# Patient Record
Sex: Male | Born: 1975 | Race: White | Hispanic: No | Marital: Married | Smoking: Never smoker
Health system: Southern US, Academic
[De-identification: ages and names within clinical notes are randomized; demographics above are authoritative.]

---

## 2015-03-26 IMAGING — CT CT ABD-PELV W/O CM
2 of 4 series · 17 of 46 positions shown, 19 images · non-contrast
Comparison: None.

CLINICAL DATA: Left flank pain.

EXAM:
CT ABDOMEN AND PELVIS WITHOUT CONTRAST
TECHNIQUE: Multidetector CT imaging of the abdomen and pelvis was performed
following the standard protocol without intravenous contrast.

[Series 2: stone study 5.0 i30f 1 · axial · 0.74mm/px · z∈[+835,+1295]mm · 14 of 102 slices shown, 16 images]
[im 5/102  soft-tissue]
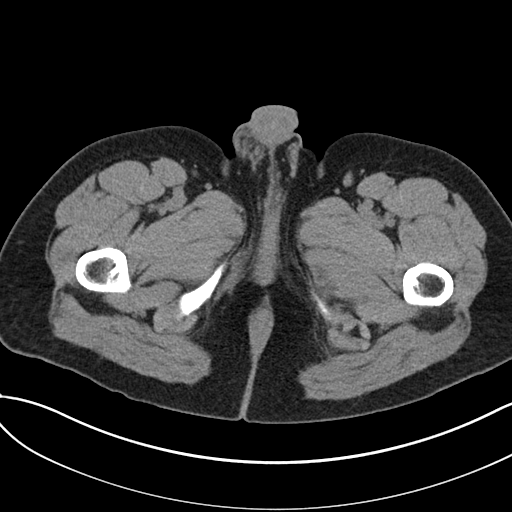
[im 5/102  bone]
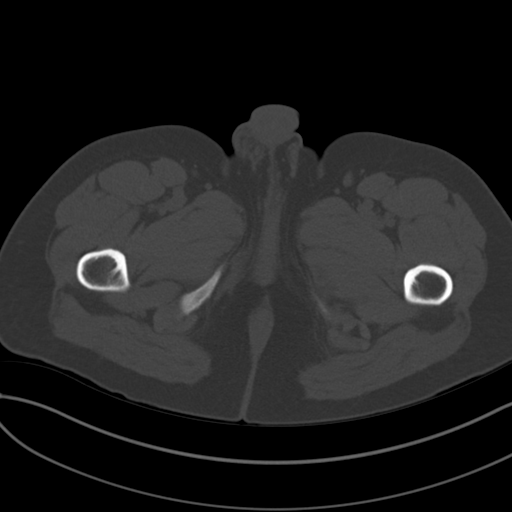
[im 13/102  soft-tissue]
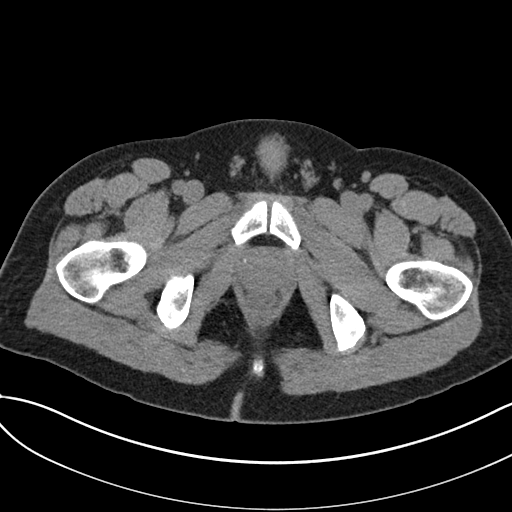
[im 21/102  soft-tissue]
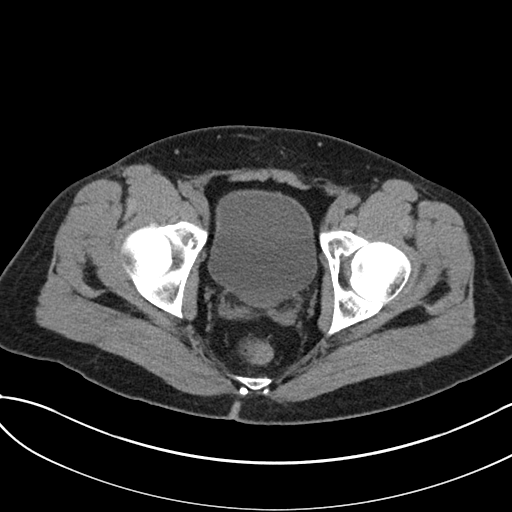
[im 29/102  soft-tissue]
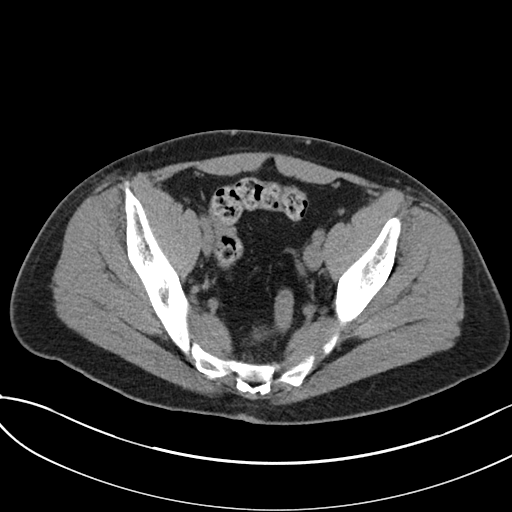
[im 33/102  soft-tissue]
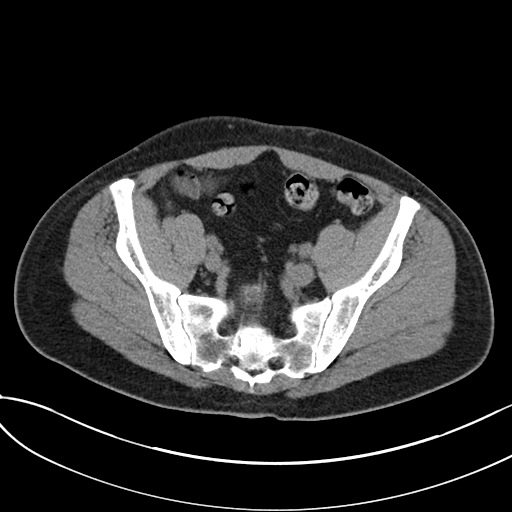
[im 41/102  soft-tissue]
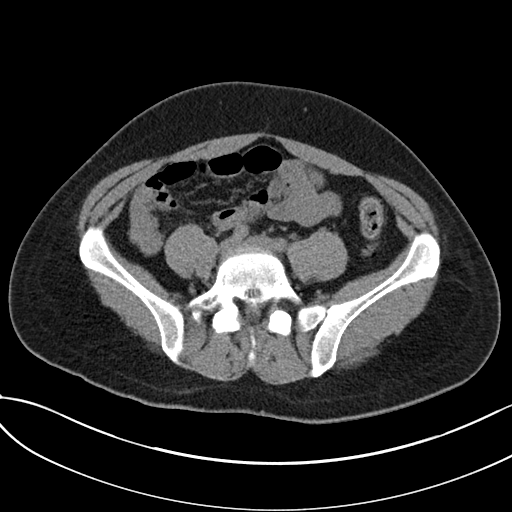
[im 49/102  soft-tissue]
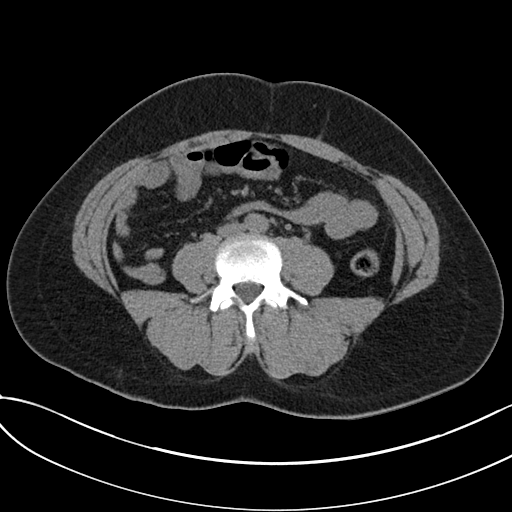
[im 53/102  soft-tissue]
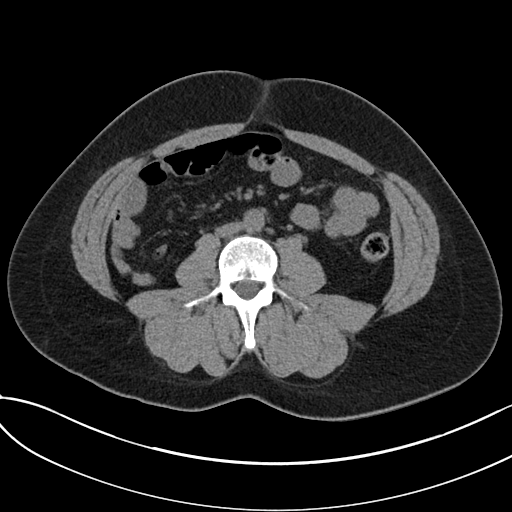
[im 61/102  soft-tissue]
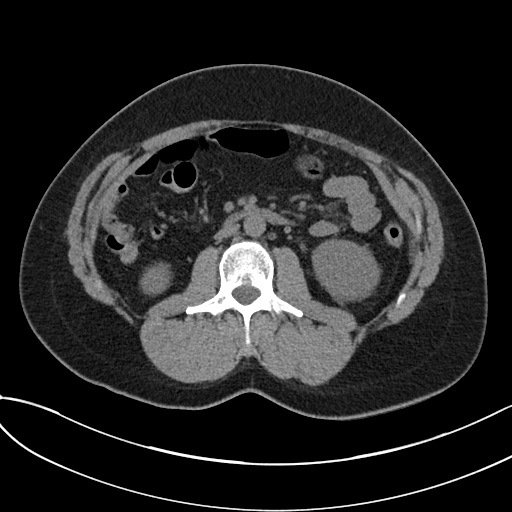
[im 61/102  bone]
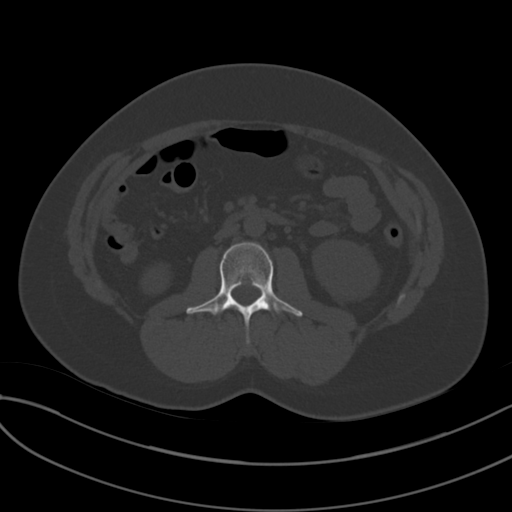
[im 69/102  soft-tissue]
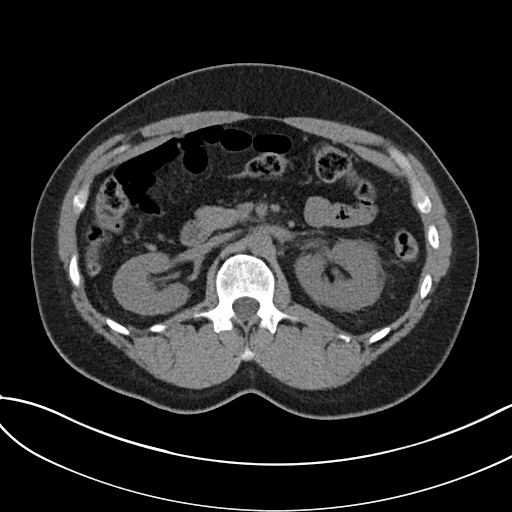
[im 77/102  soft-tissue]
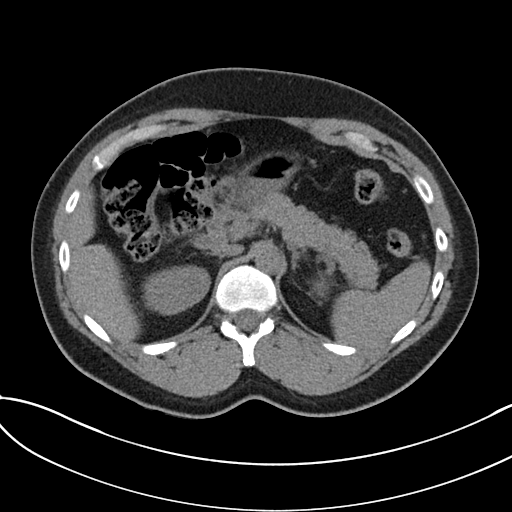
[im 81/102  soft-tissue]
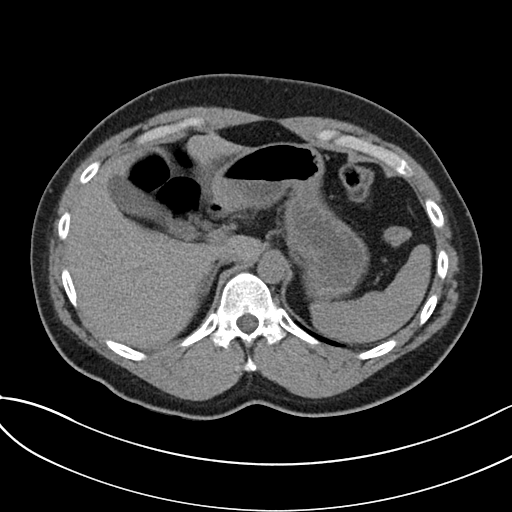
[im 89/102  soft-tissue]
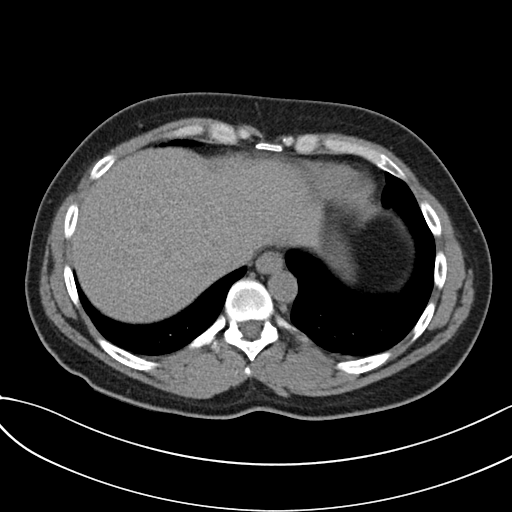
[im 97/102  soft-tissue]
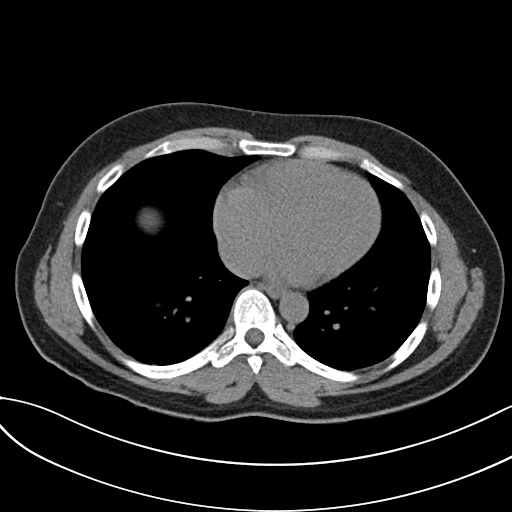

[Series 5: coronal soft tissue · coronal · 0.89mm/px · 3 of 84 slices shown]
[im 28/84  soft-tissue]
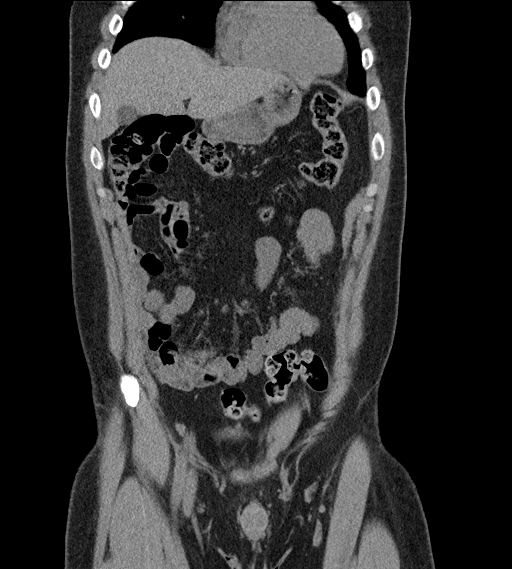
[im 37/84  soft-tissue]
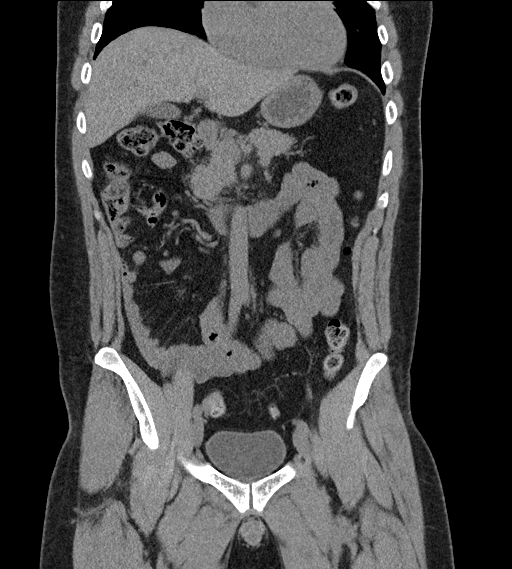
[im 47/84  soft-tissue]
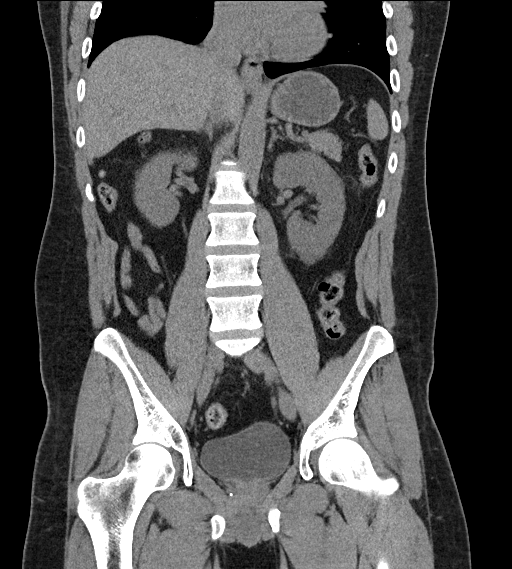

[17 of 46 positions shown; findings below may reference images not displayed]

FINDINGS: Visualized lung bases appear normal. These unenhanced images
demonstrate no abnormality involving the liver, spleen or pancreas.
No gallstones are noted. Adrenal glands appear normal. The right
kidney appears normal. Mild left hydroureteronephrosis is noted with
perinephric stranding secondary to 2 mm calculus at the left
ureterovesical junction. The appendix appears normal. No evidence of
bowel obstruction is noted. Urinary bladder appears normal.
IMPRESSION: Mild left hydroureteronephrosis secondary to 2 mm calculus at left
ureterovesical junction.

## 2022-07-20 ENCOUNTER — Other Ambulatory Visit: Payer: Self-pay

## 2022-07-20 ENCOUNTER — Emergency Department
Admission: EM | Admit: 2022-07-20 | Discharge: 2022-07-20 | Disposition: A | Discharge status: Home / Self Care | Payer: 59 | Attending: Emergency Medicine | Admitting: Emergency Medicine

## 2022-07-20 ENCOUNTER — Emergency Department (HOSPITAL_BASED_OUTPATIENT_CLINIC_OR_DEPARTMENT_OTHER): Payer: 59

## 2022-07-20 ENCOUNTER — Encounter (HOSPITAL_BASED_OUTPATIENT_CLINIC_OR_DEPARTMENT_OTHER): Payer: Self-pay

## 2022-07-20 DIAGNOSIS — Z87442 Personal history of urinary calculi: Secondary | ICD-10-CM

## 2022-07-20 DIAGNOSIS — R944 Abnormal results of kidney function studies: Secondary | ICD-10-CM

## 2022-07-20 DIAGNOSIS — N201 Calculus of ureter: Secondary | ICD-10-CM

## 2022-07-20 LAB — URINALYSIS, MACRO/MICRO
BILIRUBIN: NEGATIVE mg/dL
BLOOD: NEGATIVE mg/dL
GLUCOSE: NEGATIVE mg/dL
KETONES: NEGATIVE mg/dL
LEUKOCYTES: NEGATIVE WBCs/uL
NITRITE: NEGATIVE
PH: 5.5 (ref 4.6–8.0)
PROTEIN: NEGATIVE mg/dL
SPECIFIC GRAVITY: >=1.03 (ref 1.003–1.035)
UROBILINOGEN: 0.2 mg/dL (ref 0.2–1.0)

## 2022-07-20 LAB — COMPREHENSIVE METABOLIC PANEL, NON-FASTING
ALBUMIN/GLOBULIN RATIO: 1.4 (ref 0.8–1.4)
ALBUMIN: 4 g/dL (ref 3.4–5.0)
ALKALINE PHOSPHATASE: 79 U/L (ref 46–116)
ALT (SGPT): 20 U/L (ref <=78)
ANION GAP: 9 mmol/L — ABNORMAL LOW (ref 10–20)
AST (SGOT): 26 U/L (ref 15–37)
BILIRUBIN TOTAL: 0.5 mg/dL (ref 0.2–1.0)
BUN/CREA RATIO: 12
BUN: 16 mg/dL (ref 7–18)
CALCIUM, CORRECTED: 8.8 mg/dL
CALCIUM: 8.8 mg/dL (ref 8.5–10.1)
CHLORIDE: 106 mmol/L (ref 98–107)
CO2 TOTAL: 26 mmol/L (ref 21–32)
CREATININE: 1.33 mg/dL — ABNORMAL HIGH (ref 0.70–1.30)
ESTIMATED GFR: 67 mL/min/{1.73_m2} (ref >59)
GLOBULIN: 2.8
GLUCOSE: 111 mg/dL — ABNORMAL HIGH (ref 74–106)
OSMOLALITY, CALCULATED: 283 mOsm/kg (ref 270–290)
POTASSIUM: 4.1 mmol/L (ref 3.5–5.1)
PROTEIN TOTAL: 6.8 g/dL (ref 6.4–8.2)
SODIUM: 141 mmol/L (ref 136–145)

## 2022-07-20 LAB — CBC WITH DIFF
BASOPHIL #: 0.02 10*3/uL (ref 0.00–0.30)
BASOPHIL %: 0 % (ref 0–3)
EOSINOPHIL #: 0.09 10*3/uL (ref 0.00–0.80)
EOSINOPHIL %: 1 % (ref 0–7)
HCT: 44.9 % (ref 42.0–51.0)
HGB: 15.1 g/dL (ref 13.5–18.0)
LYMPHOCYTE #: 1.41 10*3/uL (ref 1.10–5.00)
LYMPHOCYTE %: 16 % — ABNORMAL LOW (ref 25–45)
MCH: 29.3 pg (ref 27.0–32.0)
MCHC: 33.6 g/dL (ref 32.0–36.0)
MCV: 87.1 fL (ref 78.0–99.0)
MONOCYTE #: 0.62 10*3/uL (ref 0.00–1.30)
MONOCYTE %: 7 % (ref 0–12)
MPV: 8.3 fL (ref 7.4–10.4)
NEUTROPHIL #: 6.72 10*3/uL (ref 1.80–8.40)
NEUTROPHIL %: 76 % (ref 40–76)
PLATELETS: 208 10*3/uL (ref 140–440)
RBC: 5.15 10*6/uL (ref 4.20–6.00)
RDW: 15.3 % — ABNORMAL HIGH (ref 11.6–14.8)
WBC: 8.9 10*3/uL (ref 4.0–10.5)

## 2022-07-20 LAB — LIPASE: LIPASE: 54 U/L — ABNORMAL LOW (ref 73–393)

## 2022-07-20 MED ORDER — KETOROLAC 30 MG/ML (1 ML) INJECTION SOLUTION
INTRAMUSCULAR | Status: AC
Start: 2022-07-20 — End: 2022-07-20
  Filled 2022-07-20: qty 1

## 2022-07-20 MED ORDER — TAMSULOSIN 0.4 MG CAPSULE
ORAL_CAPSULE | ORAL | Status: AC
Start: 2022-07-20 — End: 2022-07-20
  Filled 2022-07-20: qty 1

## 2022-07-20 MED ORDER — KETOROLAC 30 MG/ML (1 ML) INJECTION SOLUTION
15.0000 mg | INTRAMUSCULAR | Status: AC
Start: 2022-07-20 — End: 2022-07-20
  Administered 2022-07-20: 15 mg via INTRAVENOUS

## 2022-07-20 MED ORDER — TAMSULOSIN 0.4 MG CAPSULE
0.4000 mg | ORAL_CAPSULE | ORAL | Status: AC
Start: 2022-07-20 — End: 2022-07-20
  Administered 2022-07-20: 0.4 mg via ORAL

## 2022-07-20 MED ORDER — ACETAMINOPHEN 500 MG TABLET
1000.0000 mg | ORAL_TABLET | Freq: Three times a day (TID) | ORAL | 0 refills | Status: AC
Start: 2022-07-20 — End: 2022-07-27

## 2022-07-20 MED ORDER — ONDANSETRON 4 MG DISINTEGRATING TABLET
4.0000 mg | ORAL_TABLET | Freq: Three times a day (TID) | ORAL | 0 refills | Status: AC | PRN
Start: 2022-07-20 — End: ?

## 2022-07-20 MED ORDER — ACETAMINOPHEN 325 MG TABLET
975.0000 mg | ORAL_TABLET | ORAL | Status: AC
Start: 2022-07-20 — End: 2022-07-20
  Administered 2022-07-20: 975 mg via ORAL

## 2022-07-20 MED ORDER — ACETAMINOPHEN 325 MG TABLET
ORAL_TABLET | ORAL | Status: AC
Start: 2022-07-20 — End: 2022-07-20
  Filled 2022-07-20: qty 3

## 2022-07-20 MED ORDER — SODIUM CHLORIDE 0.9 % IV BOLUS
1000.0000 mL | INJECTION | Status: AC
Start: 2022-07-20 — End: 2022-07-20
  Administered 2022-07-20: 1000 mL via INTRAVENOUS
  Administered 2022-07-20: 0 mL via INTRAVENOUS

## 2022-07-20 MED ORDER — TAMSULOSIN 0.4 MG CAPSULE
0.4000 mg | ORAL_CAPSULE | ORAL | 0 refills | Status: AC
Start: 2022-07-20 — End: 2022-07-27

## 2022-07-20 NOTE — ED Nurses Note (Signed)
Pt c/o's Left flank pain radiating to LLQ into groin. 8/10 . Difficulty voiding since this morning. HX kidney stones.

## 2022-07-20 NOTE — ED Nurses Note (Signed)
Pt DC home ambulatory with wife. Prescriptions x 3 e-scibed. Verbal and written instructions given. Verbalizes understanding.

## 2022-07-20 NOTE — Discharge Instructions (Signed)

## 2022-07-20 NOTE — ED Triage Notes (Signed)
Patient reports left abdominal pain radiating into left flank, reports hx of kidney stones. Pain is same. Reports unable to urinate since 5am. Some nausea, no vomiting.

## 2022-07-20 NOTE — ED Provider Notes (Signed)
[facility]  ED Primary Provider Note  Patient Name: Glenn Blake  Patient Age: 46 y.o.  Date of Birth: June 09, 1976    Chief Complaint: Abdominal Pain        History of Present Illness       Glenn Blake is a 46 y.o. male who had concerns including Abdominal Pain.  This patient is a 46 year old male with history of ureterolithiasis who presents with flank pain feeling nearly identical to past episodes of kidney stones.  Patient notes the pain is approximate 7/10, and was present when he awoke this morning at 5:00 a.m.Marland Kitchen        Review of Systems     No other overt Review of Systems are noted to be positive except noted in the HPI.      Historical Data   History Reviewed This Encounter: Medical History  Surgical History  Family History  Social History      Physical Exam   ED Triage Vitals [07/20/22 0933]   BP (Non-Invasive) (!) 149/104   Heart Rate 71   Respiratory Rate 19   Temperature 36.4 C (97.5 F)   SpO2 99 %   Weight 90.7 kg (200 lb)   Height 1.753 m (5' 9" )         Nursing notes reviewed for what could be assessed. Past Medical, Surgical, and Social history reviewed for what has been completed. Exam limited in the setting of personal protective equipment.    Constitutional:  The patient is pacing around the room in discomfort. Well-Developed. Well Nourished.  Head: Normocephalic, atraumatic.  Ears:  Normal to exterior evaluation  Eyes: EOM grossly intact, conjunctiva normal.  Neck: Supple  Cardiovascular: Regular Rate and Rhythm, extremities well perfused.  Pulmonary/Chest: No respiratory distress. Lungs are symmetric to auscultation bilaterally.  Abdominal: Soft, left-sided tenderness, non-distended. Non peritoneal, no rebound, no guarding.  MSK: No Lower Extremity Edema.  Skin: Warm, dry, and intact  Neuro: Appropriate, CN II-XII grossly intact.  Psych: Pleasant        On follow-up evaluation, the patient is much improved in regards to his  discomfort.      Procedures      Patient Data     Labs Ordered/Reviewed   COMPREHENSIVE METABOLIC PANEL, NON-FASTING - Abnormal; Notable for the following components:       Result Value    ANION GAP 9 (*)     CREATININE 1.33 (*)     GLUCOSE 111 (*)     All other components within normal limits    Narrative:     Estimated Glomerular Filtration Rate (eGFR) is calculated using the CKD-EPI (2021) equation, intended for patients 17 years of age and older. If gender is not documented or "unknown", there will be no eGFR calculation.   LIPASE - Abnormal; Notable for the following components:    LIPASE 54 (*)     All other components within normal limits   CBC WITH DIFF - Abnormal; Notable for the following components:    RDW 15.3 (*)     LYMPHOCYTE % 16 (*)     All other components within normal limits   URINALYSIS, MACRO/MICRO - Normal   CBC/DIFF    Narrative:     The following orders were created for panel order CBC/DIFF.  Procedure                               Abnormality  Status                     ---------                               -----------         ------                     CBC WITH PPJK[[id number]]                Abnormal            Final result                 Please view results for these tests on the individual orders.   URINALYSIS WITH REFLEX MICROSCOPIC AND CULTURE IF POSITIVE    Narrative:     The following orders were created for panel order URINALYSIS WITH REFLEX MICROSCOPIC AND CULTURE IF POSITIVE [[facility] ONLY].  Procedure                               Abnormality         Status                     ---------                               -----------         ------                     URINALYSIS, MACRO/MICRO[[id number]]      Normal              Final result                 Please view results for these tests on the individual orders.       CT ABDOMEN PELVIS WO IV CONTRAST   Final Result by Edi, Radresults In (08/11 1013)   3 mm calculus in the internal orifice of the left UVJ results in mild left  ureteral dilatation.          One or more dose reduction techniques were used (e.g., Automated exposure control, adjustment of the mA and/or kV according to patient size, use of iterative reconstruction technique).         Radiologist location ID: [place] Making          Medical Decision Making  Amount and/or Complexity of Data Reviewed  Labs: ordered.  Radiology: ordered.    Risk  OTC drugs.  Prescription drug management.          Studies Assessed:  Lab, radiology        MDM Narrative:  This patient is a 46 year old male who presents with left flank related discomfort.  Differential includes ureterolithiasis, diverticulitis, intra-abdominal pathology.  After workup, the patient was found to have ureterolithiasis.  He responded very well to symptom based treatment.  Case was discussed with Dr. Despina Pole, urologist, who felt comfortable following the patient up in clinic.  Return precautions given.  Given the patient's slightly elevated creatinine, Toradol was not prescribed at the time of discharge.  Patient was provided a urinary strainer at the time of  discharge with instructions of how to use the device.             Medications Administered in the ED   NS bolus infusion 1,000 mL (1,000 mL Intravenous New Bag/New Syringe 07/20/22 1056)   acetaminophen (TYLENOL) tablet (975 mg Oral Given 07/20/22 0955)   ketorolac (TORADOL) 30 mg/mL injection (15 mg Intravenous Given 07/20/22 1056)   tamsulosin (FLOMAX) capsule (0.4 mg Oral Given 07/20/22 0955)       Following the history, physical exam, and ED workup, the patient was deemed stable and suitable for discharge. The patient/caregiver was advised to return to the ED for any new or worsening symptoms. Discharge medications, and follow-up instructions were discussed with the patient/caregiver in detail, who verbalizes understanding. The patient/caregiver is in agreement and is comfortable with the plan of care.    Disposition: Discharged          Current Discharge Medication List        START taking these medications.        Details   acetaminophen 500 mg Tablet  Commonly known as: TYLENOL   1,000 mg, Oral, EVERY 8 HOURS  Qty: 42 Tablet  Refills: 0     ondansetron 4 mg Tablet, Rapid Dissolve  Commonly known as: ZOFRAN ODT   4 mg, Oral, EVERY 8 HOURS PRN  Qty: 10 Tablet  Refills: 0     tamsulosin 0.4 mg Capsule  Commonly known as: FLOMAX   0.4 mg, Oral, EVERY 24 HOURS  Qty: 7 Capsule  Refills: 0            Follow up:   Landis Gandy, DO  [place]  [address]  [place] [address]  [phone]    Call today                 Clinical Impression   Ureterolithiasis (Primary)         Current Discharge Medication List        START taking these medications    Details   acetaminophen (TYLENOL) 500 mg Oral Tablet Take 2 Tablets (1,000 mg total) by mouth Every 8 hours for 7 days  Qty: 42 Tablet, Refills: 0      ondansetron (ZOFRAN ODT) 4 mg Oral Tablet, Rapid Dissolve Take 1 Tablet (4 mg total) by mouth Every 8 hours as needed for Nausea/Vomiting  Qty: 10 Tablet, Refills: 0      tamsulosin (FLOMAX) 0.4 mg Oral Capsule Take 1 Capsule (0.4 mg total) by mouth Every 24 hours for 7 days  Qty: 7 Capsule, Refills: 0               R. Baldo Daub, MD, [facility]  Department of Emergency Medicine

## 2023-03-15 ENCOUNTER — Ambulatory Visit (INDEPENDENT_AMBULATORY_CARE_PROVIDER_SITE_OTHER): Payer: Self-pay | Admitting: Student in an Organized Health Care Education/Training Program

## 2024-03-23 ENCOUNTER — Encounter (INDEPENDENT_AMBULATORY_CARE_PROVIDER_SITE_OTHER): Payer: Self-pay
